# Patient Record
Sex: Female | Born: 1995 | Hispanic: No | Marital: Single | Smoking: Never smoker
Health system: Southern US, Community
[De-identification: ages and names within clinical notes are randomized; demographics above are authoritative.]

## PROBLEM LIST (undated history)

## (undated) DIAGNOSIS — Z8719 Personal history of other diseases of the digestive system: Secondary | ICD-10-CM

## (undated) DIAGNOSIS — Z8711 Personal history of peptic ulcer disease: Secondary | ICD-10-CM

## (undated) DIAGNOSIS — R1110 Vomiting, unspecified: Secondary | ICD-10-CM

## (undated) DIAGNOSIS — R197 Diarrhea, unspecified: Secondary | ICD-10-CM

## (undated) HISTORY — DX: Diarrhea, unspecified: R19.7

## (undated) HISTORY — DX: Vomiting, unspecified: R11.10

---
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
## 2017-10-20 ENCOUNTER — Emergency Department (HOSPITAL_COMMUNITY): Payer: BLUE CROSS/BLUE SHIELD

## 2017-10-20 ENCOUNTER — Encounter (HOSPITAL_COMMUNITY): Payer: Self-pay | Admitting: Emergency Medicine

## 2017-10-20 ENCOUNTER — Emergency Department (HOSPITAL_COMMUNITY)
Admission: EM | Admit: 2017-10-20 | Discharge: 2017-10-20 | Disposition: A | Discharge status: Home / Self Care | Payer: BLUE CROSS/BLUE SHIELD | Attending: Emergency Medicine | Admitting: Emergency Medicine

## 2017-10-20 ENCOUNTER — Other Ambulatory Visit: Payer: Self-pay

## 2017-10-20 DIAGNOSIS — Y939 Activity, unspecified: Secondary | ICD-10-CM | POA: Insufficient documentation

## 2017-10-20 DIAGNOSIS — Y92410 Unspecified street and highway as the place of occurrence of the external cause: Secondary | ICD-10-CM | POA: Diagnosis not present

## 2017-10-20 DIAGNOSIS — S39012A Strain of muscle, fascia and tendon of lower back, initial encounter: Secondary | ICD-10-CM | POA: Insufficient documentation

## 2017-10-20 DIAGNOSIS — Y998 Other external cause status: Secondary | ICD-10-CM | POA: Insufficient documentation

## 2017-10-20 DIAGNOSIS — S3992XA Unspecified injury of lower back, initial encounter: Secondary | ICD-10-CM | POA: Diagnosis present

## 2017-10-20 DIAGNOSIS — S161XXA Strain of muscle, fascia and tendon at neck level, initial encounter: Secondary | ICD-10-CM | POA: Insufficient documentation

## 2017-10-20 MED ORDER — IBUPROFEN 600 MG PO TABS: 600.0000 mg | ORAL_TABLET | Freq: Four times a day (QID) | ORAL | 0 refills | Status: DC | PRN

## 2017-10-20 MED ORDER — CYCLOBENZAPRINE HCL 5 MG PO TABS: 5.0000 mg | ORAL_TABLET | Freq: Three times a day (TID) | ORAL | 0 refills | Status: DC | PRN
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
## 2017-10-20 NOTE — ED Notes (Signed)
Pt has refused urine pregnancy. Have notified xray and they stated they are on her way to get her

## 2017-10-20 NOTE — ED Notes (Signed)
Patient refused urine pregnancy.
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
## 2019-02-07 ENCOUNTER — Other Ambulatory Visit: Payer: Self-pay

## 2019-02-07 ENCOUNTER — Encounter (HOSPITAL_COMMUNITY): Payer: Self-pay | Admitting: Emergency Medicine

## 2019-02-07 ENCOUNTER — Emergency Department (HOSPITAL_COMMUNITY)
Admission: EM | Admit: 2019-02-07 | Discharge: 2019-02-08 | Disposition: A | Discharge status: Home / Self Care | Payer: Medicaid Other | Attending: Emergency Medicine | Admitting: Emergency Medicine

## 2019-02-07 DIAGNOSIS — S169XXA Unspecified injury of muscle, fascia and tendon at neck level, initial encounter: Secondary | ICD-10-CM | POA: Diagnosis not present

## 2019-02-07 DIAGNOSIS — S161XXA Strain of muscle, fascia and tendon at neck level, initial encounter: Secondary | ICD-10-CM

## 2019-02-07 DIAGNOSIS — Y92410 Unspecified street and highway as the place of occurrence of the external cause: Secondary | ICD-10-CM | POA: Insufficient documentation

## 2019-02-07 DIAGNOSIS — Y93I9 Activity, other involving external motion: Secondary | ICD-10-CM | POA: Diagnosis not present

## 2019-02-07 DIAGNOSIS — Y999 Unspecified external cause status: Secondary | ICD-10-CM | POA: Diagnosis not present

## 2019-02-07 DIAGNOSIS — Z793 Long term (current) use of hormonal contraceptives: Secondary | ICD-10-CM | POA: Diagnosis not present

## 2019-02-07 DIAGNOSIS — S4991XA Unspecified injury of right shoulder and upper arm, initial encounter: Secondary | ICD-10-CM | POA: Diagnosis present

## 2019-02-07 DIAGNOSIS — S46911A Strain of unspecified muscle, fascia and tendon at shoulder and upper arm level, right arm, initial encounter: Secondary | ICD-10-CM | POA: Insufficient documentation

## 2019-02-07 HISTORY — DX: Personal history of peptic ulcer disease: Z87.11
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
## 2019-02-08 MED ORDER — IBUPROFEN 800 MG PO TABS: 800.0000 mg | ORAL_TABLET | Freq: Four times a day (QID) | ORAL | 0 refills | Status: AC | PRN

## 2019-02-08 MED ORDER — METHOCARBAMOL 500 MG PO TABS: 500.0000 mg | ORAL_TABLET | Freq: Three times a day (TID) | ORAL | 0 refills | Status: AC | PRN
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
## 2021-05-11 ENCOUNTER — Ambulatory Visit (HOSPITAL_COMMUNITY): Payer: Self-pay | Admitting: Psychiatry

## 2021-05-27 ENCOUNTER — Telehealth (HOSPITAL_COMMUNITY): Payer: Self-pay | Admitting: *Deleted

## 2021-05-27 NOTE — Telephone Encounter (Signed)
Had to cancel patient appt that was scheduled for 05-28-2020 ?

## 2021-05-28 ENCOUNTER — Ambulatory Visit (HOSPITAL_COMMUNITY): Payer: Self-pay | Admitting: Psychiatry
